# Patient Record
Sex: Male | Born: 1985 | Race: Black or African American | Hispanic: No | Marital: Single | Smoking: Current some day smoker
Health system: Southern US, Community
[De-identification: ages and names within clinical notes are randomized; demographics above are authoritative.]

## PROBLEM LIST (undated history)

## (undated) HISTORY — PX: OTHER SURGICAL HISTORY: SHX169

## (undated) HISTORY — PX: FACIAL RECONSTRUCTION SURGERY: SHX631

---

## 2005-06-14 ENCOUNTER — Emergency Department: Payer: Self-pay | Admitting: Emergency Medicine

## 2006-07-06 ENCOUNTER — Emergency Department: Payer: Self-pay | Admitting: Emergency Medicine

## 2006-07-15 ENCOUNTER — Ambulatory Visit: Payer: Self-pay | Admitting: Otolaryngology

## 2007-06-19 IMAGING — CT CT MAXILLOFACIAL WITHOUT CONTRAST
1 series · 16 of 30 positions shown, 20 images · non-contrast
Comparison: none

REASON FOR EXAM: INJURY
COMMENTS:

[Series 3: coronal · coronal · 0.32mm/px · 16 of 39 slices shown, 20 images]
[im 2/39  brain]
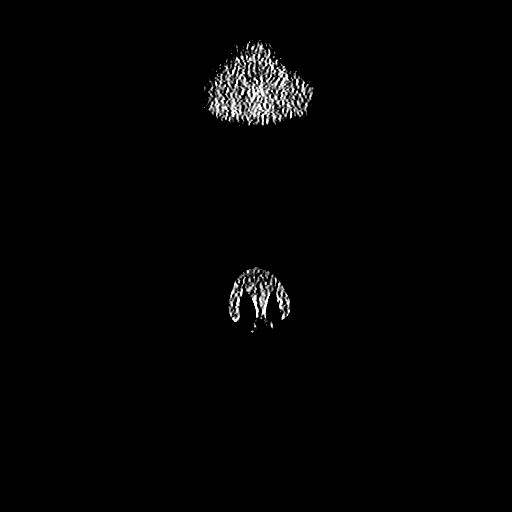
[im 2/39  bone]
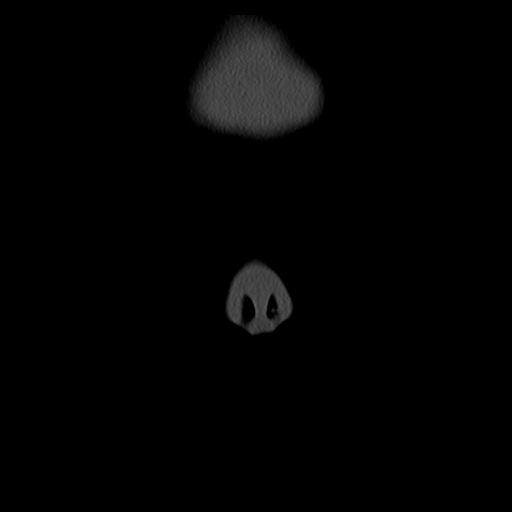
[im 4/39  bone]
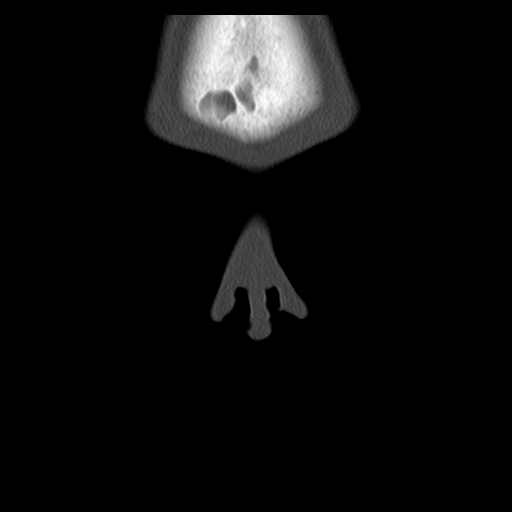
[im 7/39  bone]
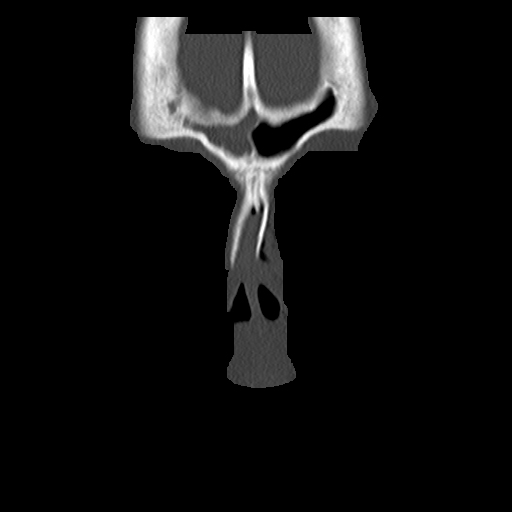
[im 10/39  bone]
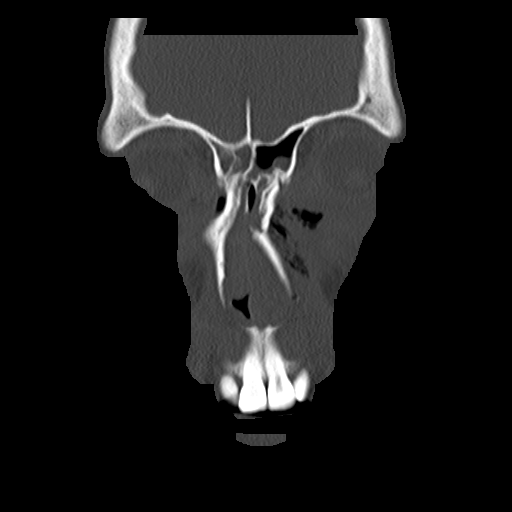
[im 11/39  brain]
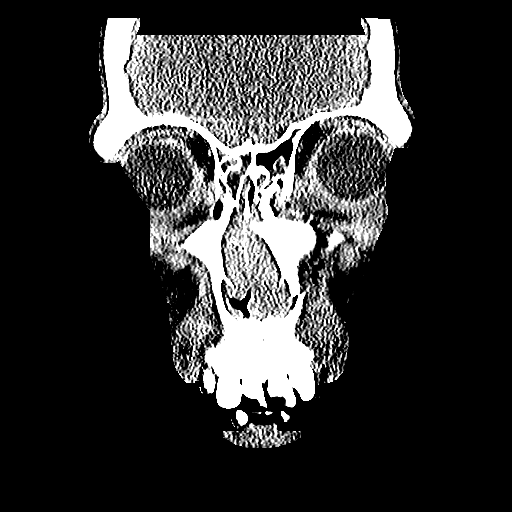
[im 11/39  bone]
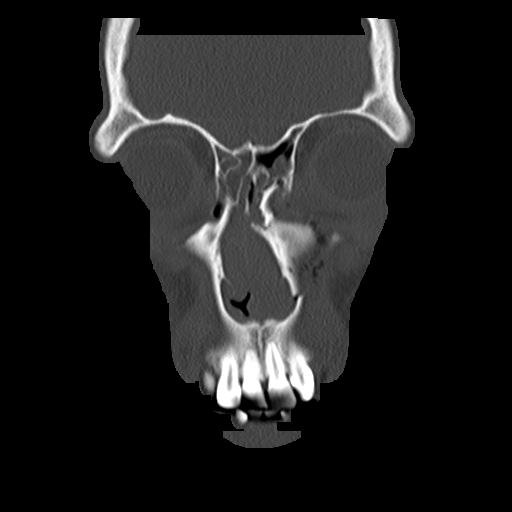
[im 14/39  bone]
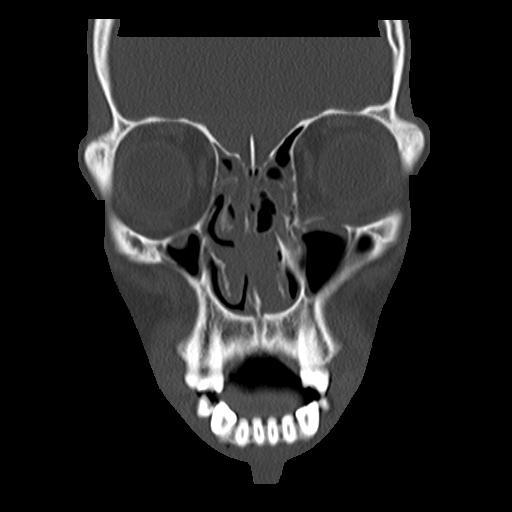
[im 16/39  bone]
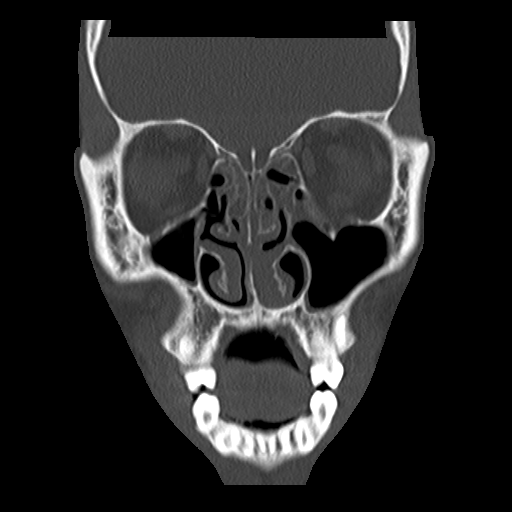
[im 19/39  bone]
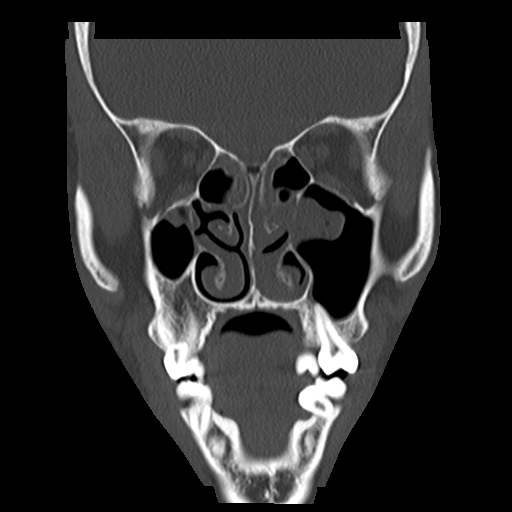
[im 20/39  brain]
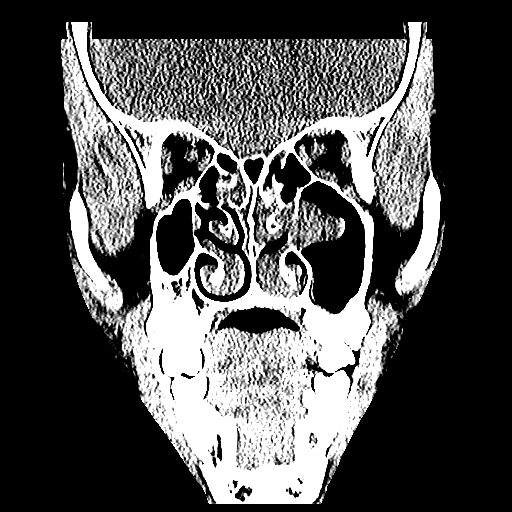
[im 20/39  bone]
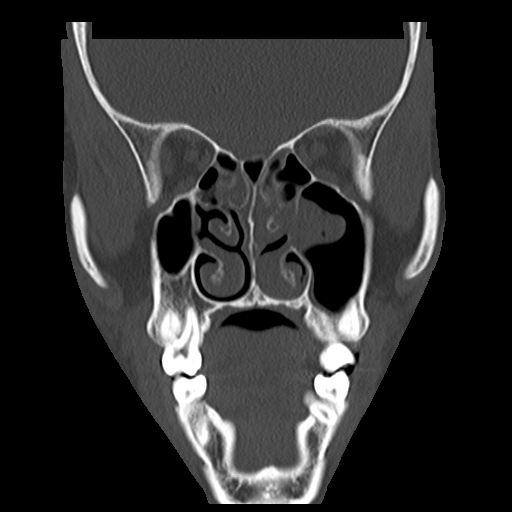
[im 23/39  bone]
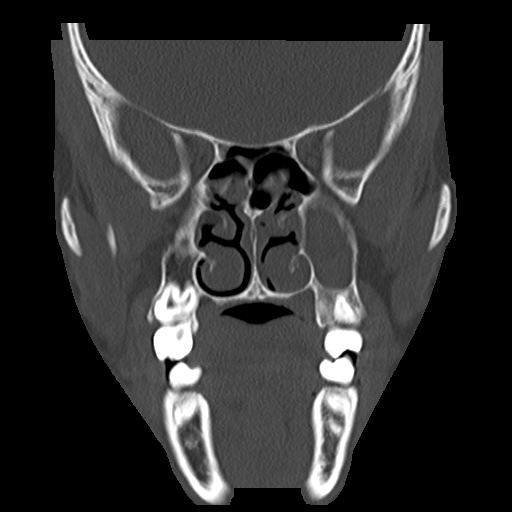
[im 25/39  bone]
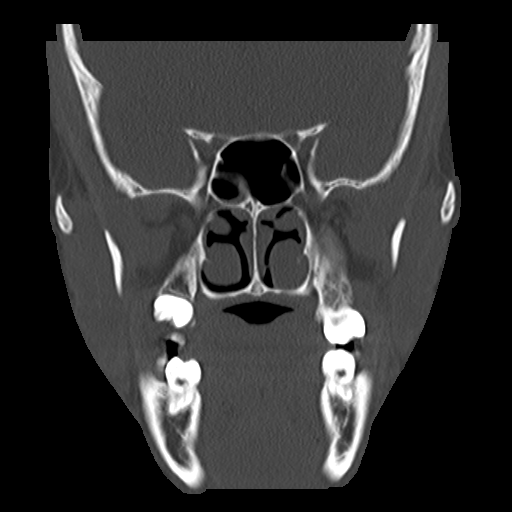
[im 28/39  bone]
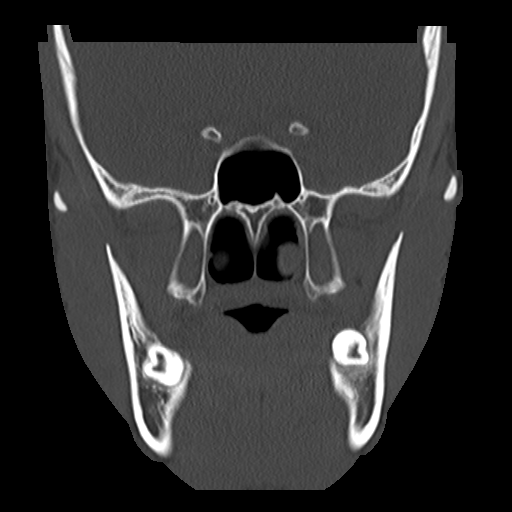
[im 29/39  brain]
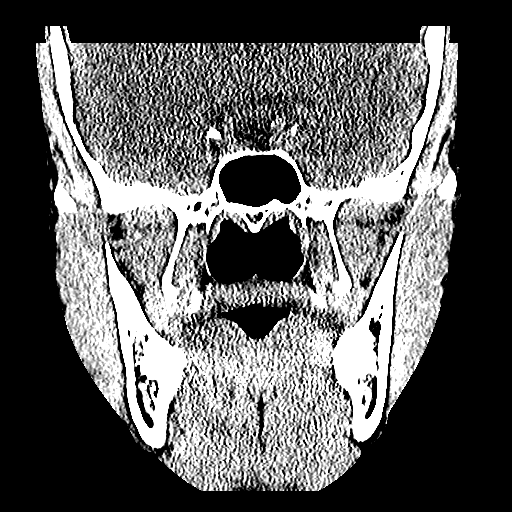
[im 29/39  bone]
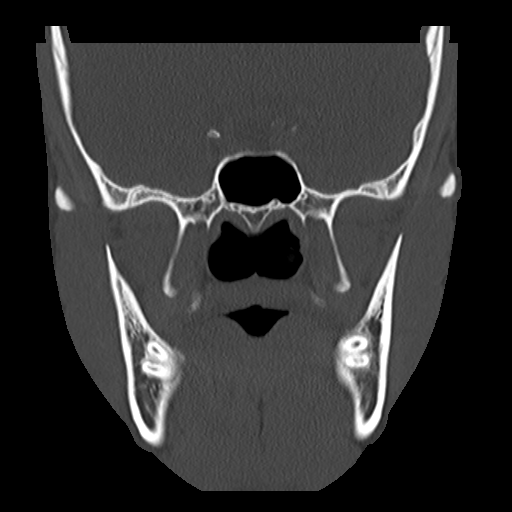
[im 32/39  bone]
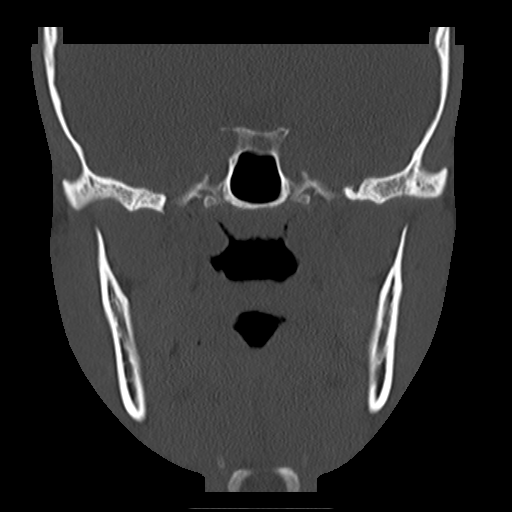
[im 35/39  bone]
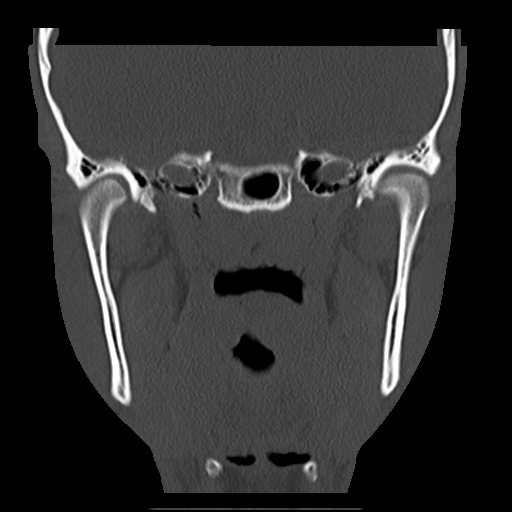
[im 37/39  bone]
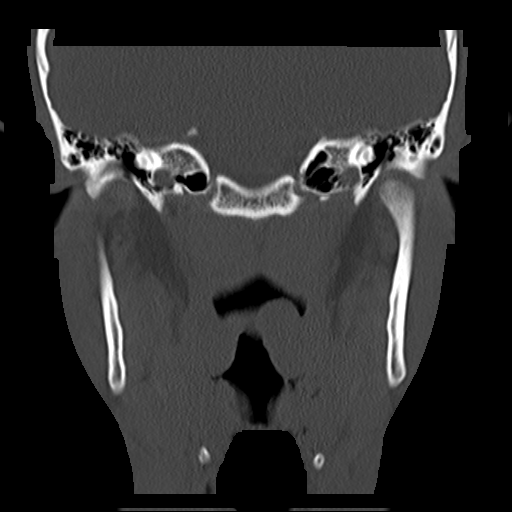

[16 of 30 positions shown; findings below may reference images not displayed]

PROCEDURE:     CT  - CT MAXILLOFACIAL AREA WO  - July 06, 2006  [DATE]

RESULT:         Comminuted fracture of the anterior maxillary wall and
medial maxillary sinus wall noted.  Posterior inferior orbital wall appears
to be fractured.   LEFT nasal fracture is present.  This is displaced.
Fluid is noted in the frontal and ethmoid sinus complexes bilaterally.
Fluid is noted in the LEFT maxillary sinus.  Subcutaneous air is noted about
the LEFT orbit.  The globe appears grossly intact as does the retrobulbar
space.  Zygomatic arches are intact.
IMPRESSION: 1.     Fractures of the anterior and medial walls of the LEFT maxillary
sinus are noted.  These are comminuted and displaced.  Inferior orbital rim
fracture on the LEFT is also present.   This is also severely displaced.
2.     There is associated severe comminuted displaced nasal fracture on the
LEFT.

## 2007-06-19 IMAGING — CR DG CHEST 2V
1 series · 2 of 2 positions shown · non-contrast
Comparison: none

REASON FOR EXAM: INJURY
COMMENTS:

PROCEDURE:     DXR - DXR CHEST PA (OR AP) AND LATERAL  - July 06, 2006  [DATE]
RESULT:     The lungs are well expanded and clear. The heart and pulmonary
vascularity are within the limits of normal. There is no pleural effusion.
The mediastinum is not widened.

[Series 1: view not recorded · 0.17mm/px · 2 of 2 slices shown]
[im 1/2]
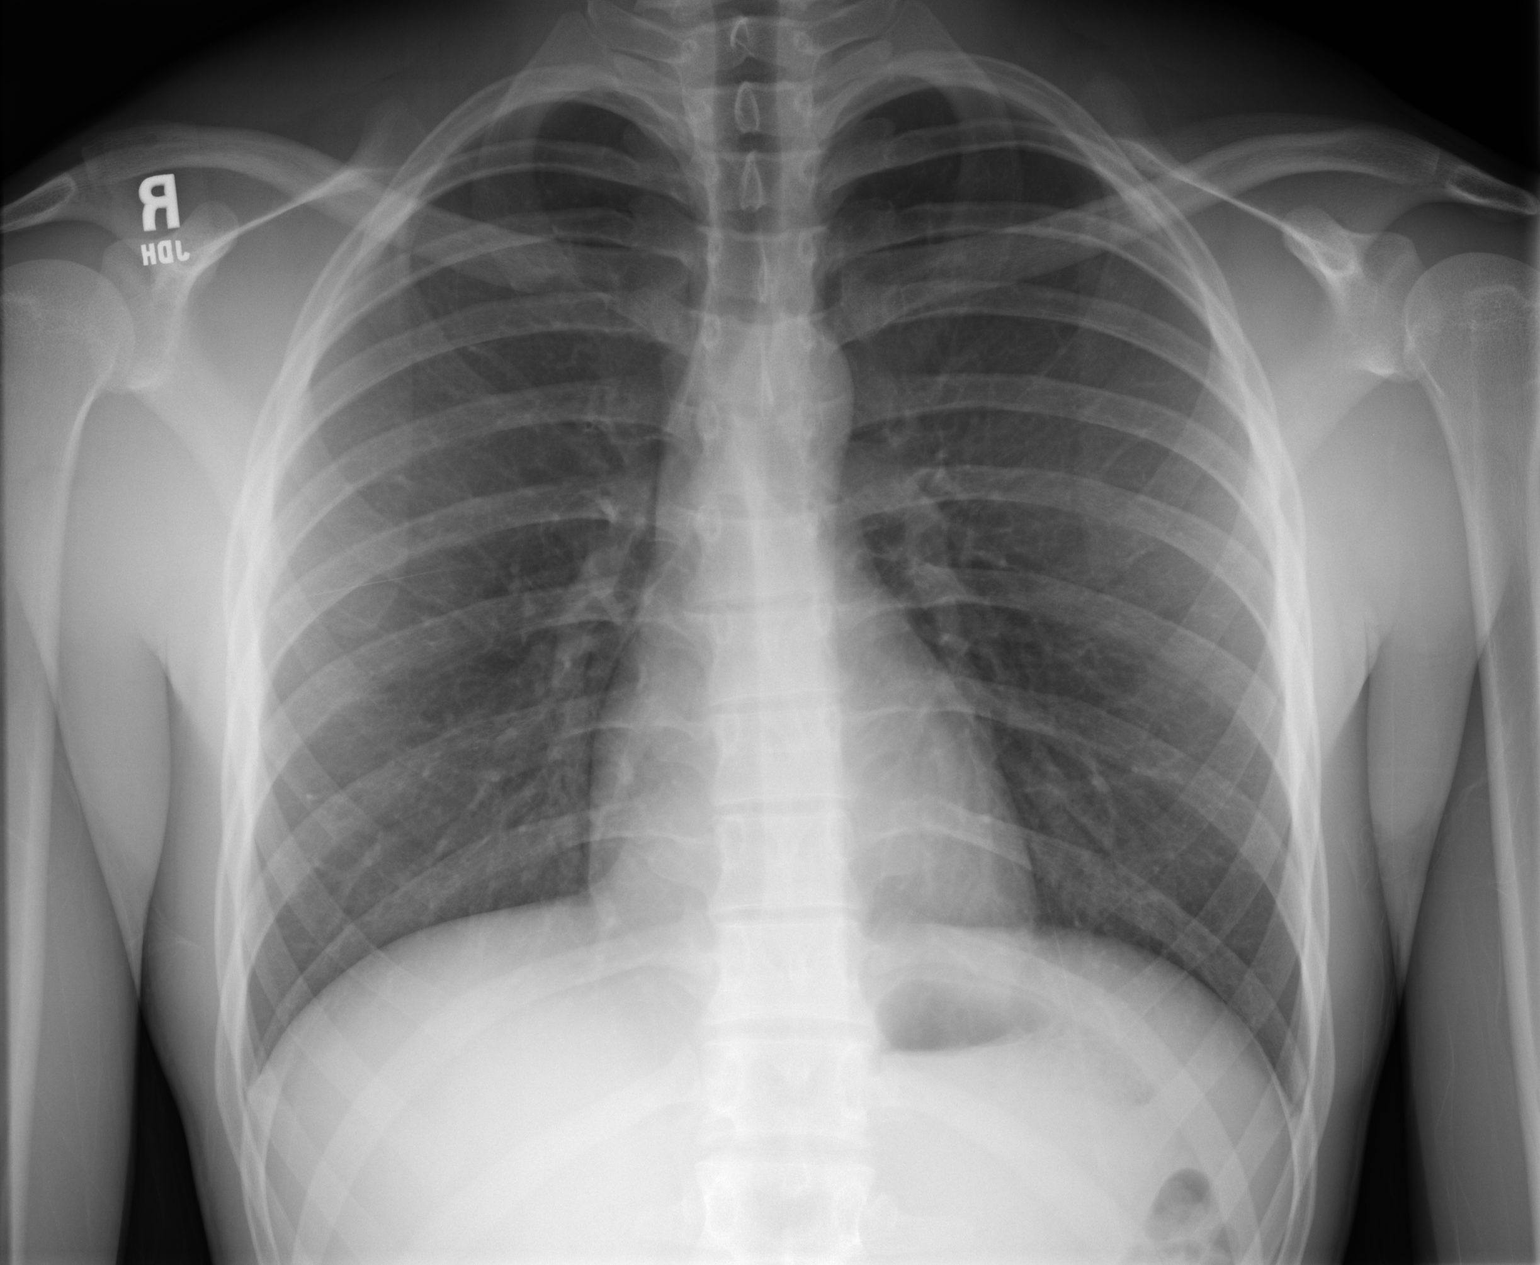
[im 2/2]
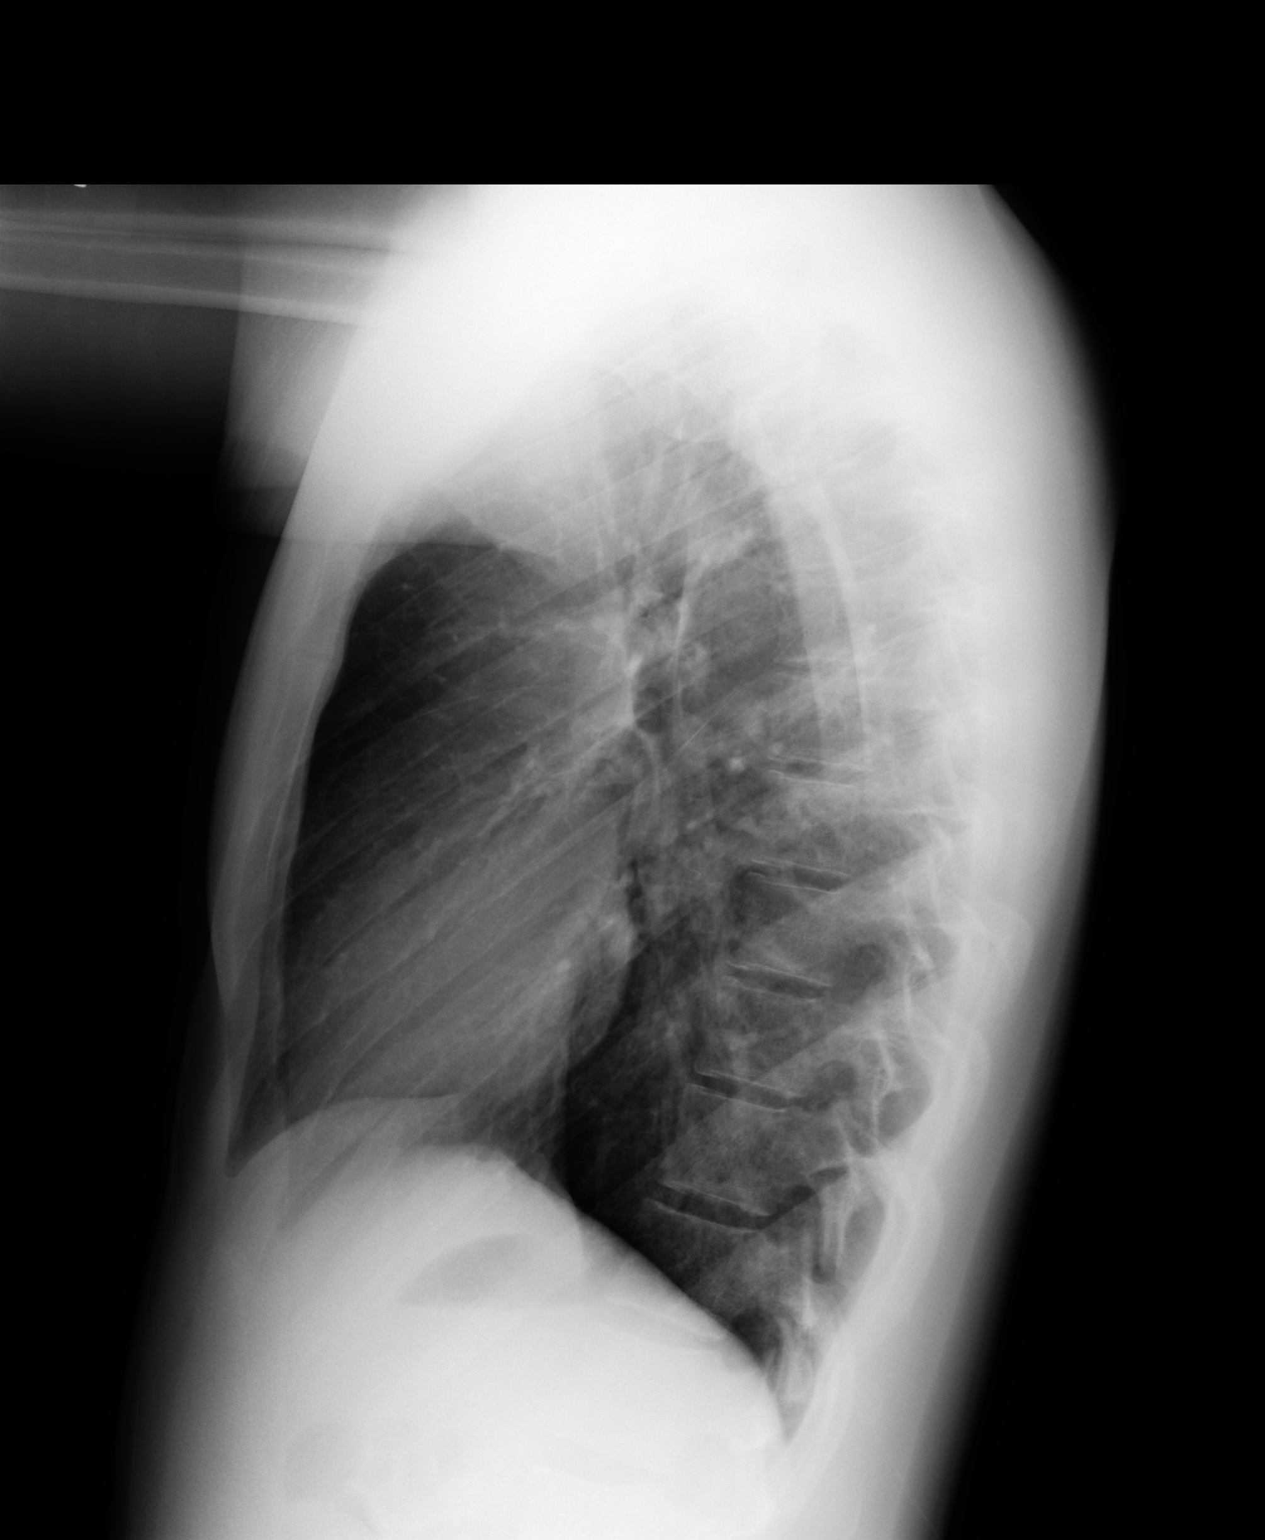

[2 of 2 positions shown; findings below may reference images not displayed]

IMPRESSION: I do not see evidence of acute cardiopulmonary disease. The
site of the patient's injury was not noted in the clinical history. I do not
see evidence of a pneumothorax or displaced rib fracture nor findings
suggestive of a pulmonary contusion. Follow-up CT imaging is available upon
request.

## 2008-10-10 ENCOUNTER — Emergency Department: Payer: Self-pay | Admitting: Emergency Medicine

## 2009-05-02 ENCOUNTER — Emergency Department: Payer: Self-pay | Admitting: Emergency Medicine

## 2009-09-11 ENCOUNTER — Emergency Department: Payer: Self-pay | Admitting: Emergency Medicine

## 2012-07-13 ENCOUNTER — Emergency Department: Payer: Self-pay | Admitting: Emergency Medicine

## 2013-06-26 IMAGING — CR RIGHT HAND - COMPLETE 3+ VIEW
1 series · 3 of 3 positions shown · non-contrast
Comparison: none

REASON FOR EXAM: swelling pain
COMMENTS:

PROCEDURE:     DXR - DXR HAND RT COMPLETE W/OBLIQUES  - July 13, 2012 [DATE]
RESULT:     Images of the right hand demonstrate no fracture, dislocation or
radiopaque foreign body. There is no evidence of bony destruction.

[Series 1: x hand pa right · 0.14mm/px · 3 of 3 slices shown]
[im 1/3]
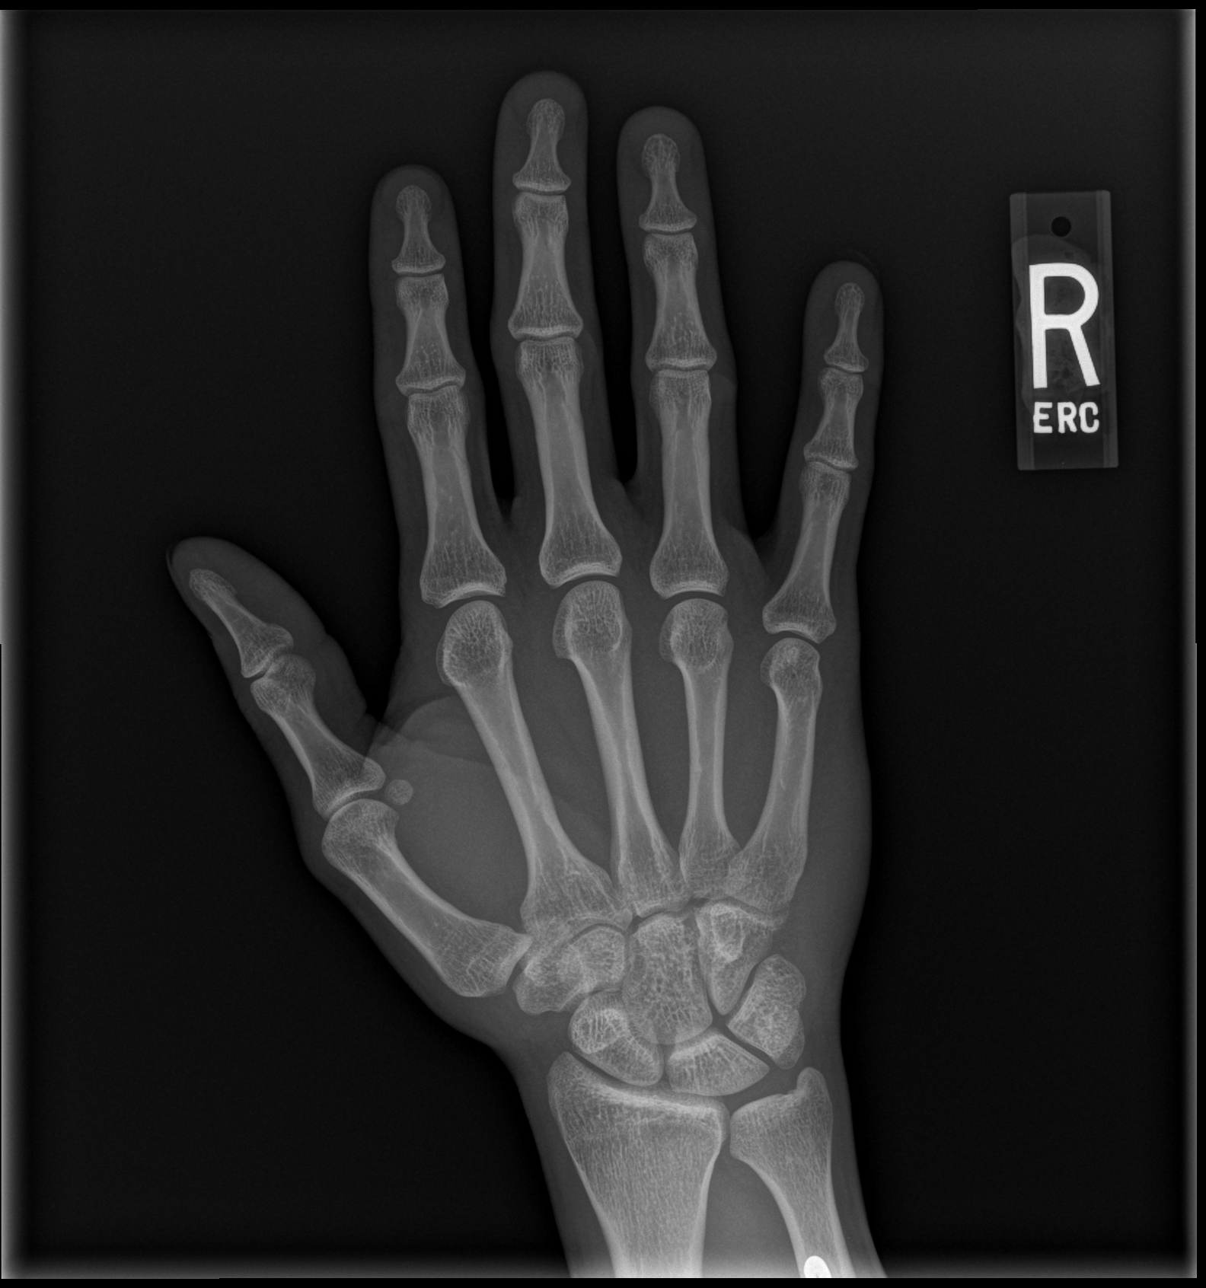
[im 2/3]
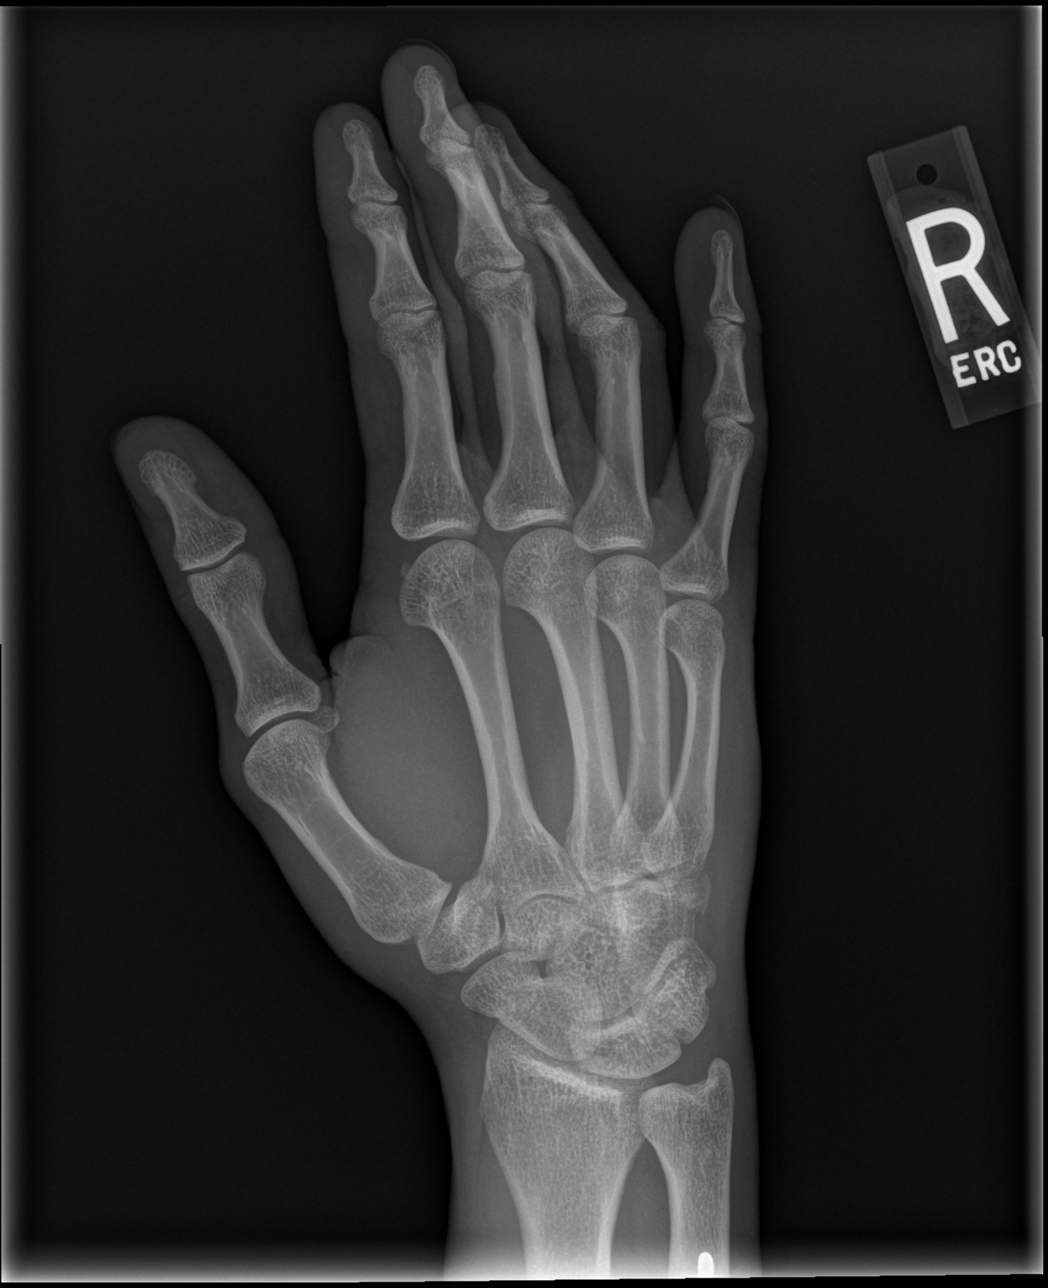
[im 3/3]
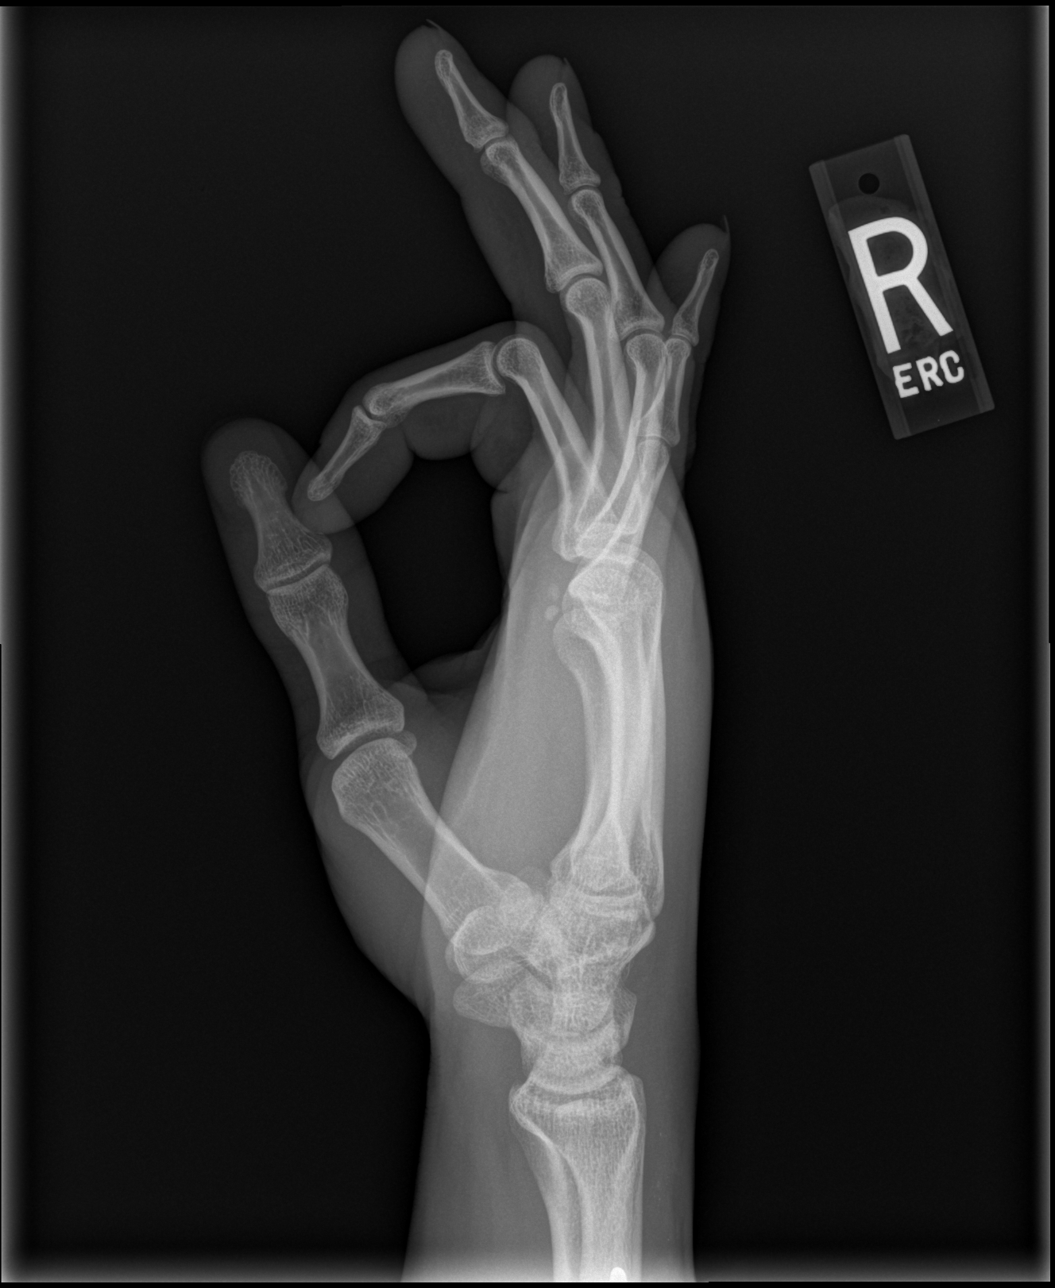

[3 of 3 positions shown; findings below may reference images not displayed]

IMPRESSION: Please see above.

[REDACTED]

## 2015-03-20 ENCOUNTER — Emergency Department: Payer: Self-pay | Admitting: Emergency Medicine

## 2015-03-20 LAB — CBC WITH DIFFERENTIAL/PLATELET
BASOS PCT: 0.3 %
Basophil #: 0 10*3/uL (ref 0.0–0.1)
Eosinophil #: 0.1 10*3/uL (ref 0.0–0.7)
Eosinophil %: 1.1 %
HCT: 43.6 % (ref 40.0–52.0)
HGB: 14.9 g/dL (ref 13.0–18.0)
Lymphocyte #: 1 10*3/uL (ref 1.0–3.6)
Lymphocyte %: 8.7 %
MCH: 31.9 pg (ref 26.0–34.0)
MCHC: 34 g/dL (ref 32.0–36.0)
MCV: 94 fL (ref 80–100)
Monocyte #: 0.7 x10 3/mm (ref 0.2–1.0)
Monocyte %: 6.1 %
NEUTROS ABS: 9.5 10*3/uL — AB (ref 1.4–6.5)
Neutrophil %: 83.8 %
Platelet: 224 10*3/uL (ref 150–440)
RBC: 4.65 10*6/uL (ref 4.40–5.90)
RDW: 13.4 % (ref 11.5–14.5)
WBC: 11.3 10*3/uL — AB (ref 3.8–10.6)

## 2015-03-20 LAB — COMPREHENSIVE METABOLIC PANEL
ALT: 12 U/L — AB
ANION GAP: 8 (ref 7–16)
Albumin: 4.7 g/dL
Alkaline Phosphatase: 52 U/L
BILIRUBIN TOTAL: 1.4 mg/dL — AB
BUN: 13 mg/dL
CO2: 27 mmol/L
Calcium, Total: 9.4 mg/dL
Chloride: 102 mmol/L
Creatinine: 1.05 mg/dL
EGFR (African American): >60
GLUCOSE: 96 mg/dL
POTASSIUM: 4 mmol/L
SGOT(AST): 21 U/L
SODIUM: 137 mmol/L
TOTAL PROTEIN: 7.3 g/dL

## 2015-03-20 LAB — LIPASE, BLOOD: Lipase: 21 U/L — ABNORMAL LOW

## 2016-10-27 ENCOUNTER — Encounter: Payer: Self-pay | Admitting: Emergency Medicine

## 2016-10-27 ENCOUNTER — Emergency Department
Admission: EM | Admit: 2016-10-27 | Discharge: 2016-10-27 | Disposition: A | Discharge status: Home / Self Care | Payer: Managed Care, Other (non HMO) | Attending: Emergency Medicine | Admitting: Emergency Medicine

## 2016-10-27 DIAGNOSIS — J069 Acute upper respiratory infection, unspecified: Secondary | ICD-10-CM | POA: Insufficient documentation

## 2016-10-27 DIAGNOSIS — F17200 Nicotine dependence, unspecified, uncomplicated: Secondary | ICD-10-CM | POA: Diagnosis not present

## 2016-10-27 DIAGNOSIS — R0981 Nasal congestion: Secondary | ICD-10-CM | POA: Diagnosis present

## 2016-10-27 LAB — POCT RAPID STREP A: Streptococcus, Group A Screen (Direct): NEGATIVE

## 2016-10-27 LAB — INFLUENZA PANEL BY PCR (TYPE A & B)
INFLBPCR: NEGATIVE
Influenza A By PCR: NEGATIVE

## 2016-10-27 MED ORDER — IBUPROFEN 600 MG PO TABS
600.0000 mg | ORAL_TABLET | Freq: Once | ORAL | Status: AC
  Administered 2016-10-27: 600 mg via ORAL
  Filled 2016-10-27: qty 1

## 2016-10-27 MED ORDER — OXYMETAZOLINE HCL 0.05 % NA SOLN
1.0000 | Freq: Once | NASAL | Status: AC
  Administered 2016-10-27: 1 via NASAL
  Filled 2016-10-27: qty 15

## 2016-10-27 MED ORDER — BENZONATATE 100 MG PO CAPS: 100.0000 mg | ORAL_CAPSULE | Freq: Four times a day (QID) | ORAL | 0 refills | Status: AC | PRN

## 2016-10-27 MED ORDER — BENZONATATE 100 MG PO CAPS
100.0000 mg | ORAL_CAPSULE | Freq: Once | ORAL | Status: AC
  Administered 2016-10-27: 100 mg via ORAL
  Filled 2016-10-27: qty 1

## 2016-10-27 MED ORDER — OXYMETAZOLINE HCL 0.05 % NA SOLN: 2.0000 | Freq: Two times a day (BID) | NASAL | 0 refills | Status: AC

## 2016-10-27 MED ORDER — ACETAMINOPHEN 325 MG PO TABS
650.0000 mg | ORAL_TABLET | Freq: Once | ORAL | Status: AC
Start: 2016-10-27 — End: 2016-10-27
  Administered 2016-10-27: 650 mg via ORAL
  Filled 2016-10-27: qty 2

## 2016-10-27 NOTE — ED Notes (Signed)

## 2016-10-27 NOTE — ED Notes (Signed)
Patient asking for liquid cough syrup citing that he could not taking pills. Patient took IBU, APAP, and Tessalon here without difficulties. Patient adamant that this RN ask MD for liquid. RN already instructed patient to obtain OTC syrup, however he wanted MD consulted for "something stronger". Spoke with Dr. Zenda AlpersWebster - MD advising that patient will need to get OTC Robitussin or Delsym if something other than the prescribed Tessalon was needed to treat his symptoms. Patient states, "I was hoping for that stuff that would knock me out"; MD aware.

## 2016-10-27 NOTE — ED Triage Notes (Signed)
Pt reports sore throat, cough, runny nose and chills for the last 2 days with intermittent headache pain to frontal area; has not taken any OTC meds for relief; pt in no acute distress;

## 2016-10-27 NOTE — ED Provider Notes (Signed)
[facility] Emergency Department Provider Note   ____________________________________________   First MD Initiated Contact with Patient 10/27/16 0330     (approximate)  I have reviewed the triage vital signs and the nursing notes.   HISTORY  Chief Complaint Cough; Sore Throat; Nasal Congestion; and Chills    HPI Johnny Kennedy is a 30 y.o. male who comes into the hospital today with a concern that he has an upper aspirin infection. The patient has had a cough with a headache. He's had these symptoms for the past 2 days. He denies any fever although he thinks he may have been feverish. He denies any sick contacts and he has not taken any medications for his symptoms. The patient reports that he's been unable to get some sleep so he thinks he may need something. He's had some nasal congestion as well as chills. He reports he's had a cold before but he has not had it this bad. The patient has head pain that he rates a 6 out of 10 in intensity. He hasn't eaten much as well due to not feeling well. He did have one episode of vomiting after coughing but no other nausea, shortness of breath, chest pain, abdominal pain. The patient is here tonight for evaluation.   History reviewed. No pertinent past medical history.  There are no active problems to display for this patient.   Past Surgical History:  Procedure Laterality Date  . facial reconstructive surgery      Prior to Admission medications   Not on File    Allergies Patient has no allergy information on record.  History reviewed. No pertinent family history.  Social History Social History  Substance Use Topics  . Smoking status: Current Some Day Smoker  . Smokeless tobacco: Never Used  . Alcohol use Yes    Review of Systems Constitutional: chills Eyes: No visual changes. ENT:  sore throat. Cardiovascular: Denies chest pain. Respiratory: Cough Gastrointestinal: No abdominal pain.  No  nausea, no vomiting.  No diarrhea.  No constipation. Genitourinary: Negative for dysuria. Musculoskeletal: Negative for back pain. Skin: Negative for rash. Neurological: Headache  10-point ROS otherwise negative.  ____________________________________________   PHYSICAL EXAM:  VITAL SIGNS: ED Triage Vitals [10/27/16 0126]  Enc Vitals Group     BP (!) 157/95     Pulse Rate (!) 102     Resp 18     Temp 98.8 F (37.1 C)     Temp Source Oral     SpO2 99 %     Weight 140 lb (63.5 kg)     Height 5\' 9"  (1.753 m)     Head Circumference      Peak Flow      Pain Score 5     Pain Loc      Pain Edu?      Excl. in GC?     Constitutional: Alert and oriented. Well appearing and in Mild distress. Eyes: Conjunctivae are normal. PERRL. EOMI. Head: Atraumatic. Nose: No congestion/rhinnorhea. Mouth/Throat: Mucous membranes are moist.  Oropharynx non-erythematous. Cardiovascular: Normal rate, regular rhythm. Grossly normal heart sounds.  Good peripheral circulation. Respiratory: Normal respiratory effort.  No retractions. Lungs CTAB. Gastrointestinal: Soft and nontender. No distention.  Musculoskeletal: No lower extremity tenderness nor edema.   Neurologic:  Normal speech and language.  Skin:  Skin is warm, dry and intact.  Psychiatric: Mood and affect are normal.   ____________________________________________   LABS (all labs ordered are listed, but only  abnormal results are displayed)  Labs Reviewed  INFLUENZA PANEL BY PCR (TYPE A & B, H1N1)  POCT RAPID STREP A   ____________________________________________  EKG  none ____________________________________________  RADIOLOGY  none ____________________________________________   PROCEDURES  Procedure(s) performed: None  Procedures  Critical Care performed: No  ____________________________________________   INITIAL IMPRESSION / ASSESSMENT AND PLAN / ED COURSE  Pertinent labs & imaging results that were  available during my care of the patient were reviewed by me and considered in my medical decision making (see chart for details).  This is a 30 year old male who comes in with some upper respiratory infection, cough, headache, congestion and chills. The patient is not been taking any medications at home. We did perform a rapid strep as well as a flu on the patient and his flu test and strep are both negative. The patient has not had any fevers. I will give the patient some IV Profen and Tylenol for his headache. He will also receive some benzonatate for his cough and Afrin for his congestion. I feel that the patient's symptoms are viral and his treatment is supportive care. I will discharge the patient home with some cough medicine as well as the Afrin and some medicine for his headache. The patient should follow-up with the acute care clinic for further evaluation. He should return if the symptoms are persistent after the next few days.  Clinical Course      ____________________________________________   FINAL CLINICAL IMPRESSION(S) / ED DIAGNOSES  Final diagnoses:  Viral upper respiratory tract infection      NEW MEDICATIONS STARTED DURING THIS VISIT:  New Prescriptions   No medications on file     Note:  This document was prepared using Dragon voice recognition software and may include unintentional dictation errors.    Rebecka ApleyAllison P Quinnlyn Hearns, MD 10/27/16 [phone]

## 2020-09-01 ENCOUNTER — Emergency Department
Admission: EM | Admit: 2020-09-01 | Discharge: 2020-09-01 | Disposition: A | Discharge status: Home / Self Care | Payer: Managed Care, Other (non HMO) | Attending: Student in an Organized Health Care Education/Training Program | Admitting: Student in an Organized Health Care Education/Training Program

## 2020-09-01 ENCOUNTER — Other Ambulatory Visit: Payer: Self-pay

## 2020-09-01 ENCOUNTER — Encounter: Payer: Self-pay | Admitting: Emergency Medicine

## 2020-09-01 DIAGNOSIS — F17200 Nicotine dependence, unspecified, uncomplicated: Secondary | ICD-10-CM | POA: Insufficient documentation

## 2020-09-01 DIAGNOSIS — Z79899 Other long term (current) drug therapy: Secondary | ICD-10-CM | POA: Insufficient documentation

## 2020-09-01 DIAGNOSIS — K047 Periapical abscess without sinus: Secondary | ICD-10-CM

## 2020-09-01 MED ORDER — TRAMADOL HCL 50 MG PO TABS: 50.0000 mg | ORAL_TABLET | Freq: Four times a day (QID) | ORAL | 0 refills | Status: AC | PRN

## 2020-09-01 MED ORDER — AMOXICILLIN 500 MG PO TABS: 500.0000 mg | ORAL_TABLET | Freq: Three times a day (TID) | ORAL | 0 refills | Status: DC

## 2020-09-01 NOTE — ED Triage Notes (Signed)
Here because when woke up this AM had swelling to left face. Was having pain to gum last night and took motrin which helped some.  NAD. Unlabored.

## 2020-09-01 NOTE — ED Provider Notes (Signed)
[facility] Emergency Department Provider Note ____________________________________________  Time seen: Approximately 9:25 AM  I have reviewed the triage vital signs and the nursing notes.   HISTORY  Chief Complaint Dental Injury   HPI Johnny Kennedy is a 34 y.o. male presents to the emergency department for treatment and evaluation of dental pain and facial swelling.  Patient states that last night he went to bed and he had some pain in his mouth but it was not swollen.  Upon awakening this morning.  His face was swollen and the pain was "excruciating."  Patient took some ibuprofen which has provided with some relief.  He does have a dentist but the dentist will not be back in the office until Monday.   History reviewed. No pertinent past medical history.  There are no problems to display for this patient.   Past Surgical History:  Procedure Laterality Date  . FACIAL RECONSTRUCTION SURGERY    . facial reconstructive surgery      Prior to Admission medications   Medication Sig Start Date End Date Taking? Authorizing Provider  amoxicillin (AMOXIL) 500 MG tablet Take 1 tablet (500 mg total) by mouth 3 (three) times daily. 09/01/20   Saima Monterroso B, FNP  benzonatate (TESSALON PERLES) 100 MG capsule Take 1 capsule (100 mg total) by mouth every 6 (six) hours as needed for cough. 10/27/16   Rebecka Apley, MD  traMADol (ULTRAM) 50 MG tablet Take 1 tablet (50 mg total) by mouth every 6 (six) hours as needed. 09/01/20   Chinita Pester, FNP    Allergies Other  History reviewed. No pertinent family history.  Social History Social History   Tobacco Use  . Smoking status: Current Some Day Smoker  . Smokeless tobacco: Never Used  Substance Use Topics  . Alcohol use: Yes  . Drug use: No    Review of Systems Constitutional: Negative for fever or recent illness. ENT: Positive for dental pain. Musculoskeletal: Negative for trismus of the jaw.  Skin:  Negative for wound or lesion. ____________________________________________   PHYSICAL EXAM:  VITAL SIGNS: ED Triage Vitals  Enc Vitals Group     BP 09/01/20 0804 123/81     Pulse Rate 09/01/20 0804 76     Resp 09/01/20 0804 18     Temp 09/01/20 0804 97.8 F (36.6 C)     Temp src --      SpO2 09/01/20 0804 99 %     Weight 09/01/20 0757 135 lb (61.2 kg)     Height 09/01/20 0757 5\' 8"  (1.727 m)     Head Circumference --      Peak Flow --      Pain Score 09/01/20 0757 5     Pain Loc --      Pain Edu? --      Excl. in GC? --     Constitutional: Alert and oriented. Well appearing and in no acute distress. Eyes: Conjunctiva are clear without discharge or drainage. Mouth/Throat: Sublingual surface is soft. Periodontal Exam    Hematological/Lymphatic/Immunilogical: No palpable cervical adenopathy. Respiratory: Respirations even and unlabored. Musculoskeletal: Full ROM of the jaw. Neurologic: Awake, alert, oriented.  Skin: Facial swelling associated with area of dental decay Psychiatric: Affect and behavior intact.  ____________________________________________   LABS (all labs ordered are listed, but only abnormal results are displayed)  Labs Reviewed - No data to display ____________________________________________   RADIOLOGY  Not indicated. ____________________________________________   PROCEDURES  Procedure(s) performed:   Procedures  Critical Care performed: No ____________________________________________   INITIAL IMPRESSION / ASSESSMENT AND PLAN / ED COURSE  Johnny Kennedy is a 34 y.o. male presenting to the emergency department for treatment and evaluation of dental pain.  See HPI for further details.  Plan will be to get him on some antibiotics and have him follow-up with his dentist on Monday.  He will be encouraged to rotate Tylenol and ibuprofen for pain.  He is to return to the emergency department for symptoms of change or worsen if he is  unable to see the dentist.  Pertinent labs & imaging results that were available during my care of the patient were reviewed by me and considered in my medical decision making (see chart for details).  ____________________________________________   FINAL CLINICAL IMPRESSION(S) / ED DIAGNOSES  Final diagnoses:  Dental abscess    New Prescriptions   AMOXICILLIN (AMOXIL) 500 MG TABLET    Take 1 tablet (500 mg total) by mouth 3 (three) times daily.   TRAMADOL (ULTRAM) 50 MG TABLET    Take 1 tablet (50 mg total) by mouth every 6 (six) hours as needed.    If controlled substance prescribed during this visit, 12 month history viewed on the [facility] prior to issuing an initial prescription for Schedule II or III opiod.  Note:  This document was prepared using Dragon voice recognition software and may include unintentional dictation errors.   Chinita Pester, FNP 09/01/20 0940    Gilles Chiquito, MD 09/01/20 1017

## 2020-09-01 NOTE — Discharge Instructions (Signed)
Please call and schedule a dental appointment as soon as possible. You will need to be seen within the next 14 days. Return to the emergency department for symptoms that change or worsen if you're unable to schedule an appointment. ° °

## 2023-12-11 ENCOUNTER — Other Ambulatory Visit: Payer: Self-pay

## 2023-12-11 ENCOUNTER — Emergency Department: Payer: 59

## 2023-12-11 ENCOUNTER — Emergency Department
Admission: EM | Admit: 2023-12-11 | Discharge: 2023-12-11 | Disposition: A | Discharge status: Home / Self Care | Payer: 59 | Attending: Emergency Medicine | Admitting: Emergency Medicine

## 2023-12-11 DIAGNOSIS — H1032 Unspecified acute conjunctivitis, left eye: Secondary | ICD-10-CM | POA: Diagnosis not present

## 2023-12-11 DIAGNOSIS — R519 Headache, unspecified: Secondary | ICD-10-CM

## 2023-12-11 MED ORDER — AMOXICILLIN-POT CLAVULANATE 875-125 MG PO TABS: 1.0000 | ORAL_TABLET | Freq: Two times a day (BID) | ORAL | 0 refills | Status: AC

## 2023-12-11 MED ORDER — FLUORESCEIN SODIUM 1 MG OP STRP
1.0000 | ORAL_STRIP | Freq: Once | OPHTHALMIC | Status: AC
  Administered 2023-12-11: 1 via OPHTHALMIC
  Filled 2023-12-11: qty 1

## 2023-12-11 MED ORDER — GENTAMICIN SULFATE 0.3 % OP SOLN: 1.0000 [drp] | Freq: Three times a day (TID) | OPHTHALMIC | 0 refills | Status: AC

## 2023-12-11 NOTE — Discharge Instructions (Signed)
Your exam and CT scan are overall normal and reassuring at this time.  There is evidence of some mild acute sinusitis.  You be treated for sinus infection as well as conjunctivitis.  Take the antibiotics as prescribed and use the eyedrops as directed.

## 2023-12-11 NOTE — ED Provider Notes (Signed)
[facility] Emergency Department Provider Note     None    (approximate)   History   Migraine   HPI  Johnny Kennedy is a 37 y.o. male with a noncontributory medical history, presents to the ED with what he describes as a "migraine" with associated blurry vision.  He describes 2 weeks of symptoms that include some left eye irritation that he is showing was pinkeye.  Also noted some pressure around the left eye.  The last 2 days he has had a left-sided headache associate with symptoms.  He does the headache at the time of this presentation is nearly resolved.  Patient's not take any medications in the interim for symptom relief.  He denies any known triggers or preceding incidents.  Does not take medications for migraines.  For also endorse some dizziness but denies any nausea, vomiting, tinnitus, vertigo hearing loss, or vision loss.  No recent injury or trauma is reported.  No cough or fevers are noted.  Patient does endorse some light sensitivity.  Physical Exam   Triage Vital Signs: ED Triage Vitals  Encounter Vitals Group     BP 12/11/23 1424 124/89     Systolic BP Percentile --      Diastolic BP Percentile --      Pulse Rate 12/11/23 1424 (!) 103     Resp 12/11/23 1424 17     Temp 12/11/23 1424 98.3 F (36.8 C)     Temp Source 12/11/23 1424 Oral     SpO2 12/11/23 1424 99 %     Weight 12/11/23 1423 134 lb 14.7 oz (61.2 kg)     Height 12/11/23 1423 5\' 8"  (1.727 m)     Head Circumference --      Peak Flow --      Pain Score 12/11/23 1423 5     Pain Loc --      Pain Education --      Exclude from Growth Chart --     Most recent vital signs: Vitals:   12/11/23 1424  BP: 124/89  Pulse: (!) 103  Resp: 17  Temp: 98.3 F (36.8 C)  SpO2: 99%    General Awake, no distress. NAD HEENT NCAT. PERRL. EOMI. no gross foreign body noted to the cornea.  No fluorescein dye uptake is appreciated.  Normal fundi bilaterally.  No rhinorrhea. Mucous  membranes are moist.  CV:  Good peripheral perfusion.  RESP:  Normal effort.  ABD:  No distention.  NEURO: Regular 2-12 grossly intact.  ED Results / Procedures / Treatments   Labs (all labs ordered are listed, but only abnormal results are displayed) Labs Reviewed - No data to display   EKG   RADIOLOGY  I personally viewed and evaluated these images as part of my medical decision making, as well as reviewing the written report by the radiologist.  ED Provider Interpretation: No acute findings  CT HEAD WO CONTRAST ( ) Result Date: 12/11/2023 CLINICAL DATA:  Migraine and blurred vision, light sensitivity to EXAM: CT HEAD WITHOUT CONTRAST TECHNIQUE: Contiguous axial images were obtained from the base of the skull through the vertex without intravenous contrast. RADIATION DOSE REDUCTION: This exam was performed according to the departmental dose-optimization program which includes automated exposure control, adjustment of the mA and/or kV according to patient size and/or use of iterative reconstruction technique. COMPARISON:  None Available. FINDINGS: Brain: No evidence of acute infarction, hemorrhage, mass, mass effect, or midline shift. No hydrocephalus or extra-axial  fluid collection. Vascular: No hyperdense vessel. Skull: Negative for fracture or focal lesion. Sinuses/Orbits: Mucosal thickening in the paranasal sinuses, most prominent in the frontal sinuses and anterior ethmoid air cells, which are nearly completely opacified. No acute finding in the orbits. Other: The mastoid air cells are well aerated. IMPRESSION: 1. No acute intracranial process. 2. Mucosal thickening in the paranasal sinuses, most prominent in the frontal sinuses and anterior ethmoid air cells. This is nonspecific but could be seen in the setting of acute sinusitis. Correlate with symptoms. Electronically Signed   By: Wiliam Ke M.D.   On: 12/11/2023 15:57     PROCEDURES:  Critical Care performed:  No  Procedures   MEDICATIONS ORDERED IN ED: Medications  fluorescein ophthalmic strip 1 strip (1 strip Both Eyes Given 12/11/23 1650)     IMPRESSION / MDM / ASSESSMENT AND PLAN / ED COURSE  I reviewed the triage vital signs and the nursing notes.                              Differential diagnosis includes, but is not limited to, intracranial hemorrhage, meningitis/encephalitis, previous head trauma, cavernous venous thrombosis, tension headache, temporal arteritis, migraine or migraine equivalent, idiopathic intracranial hypertension, and non-specific headache.  Patient's presentation is most consistent with acute complicated illness / injury requiring diagnostic workup.  Patient's diagnosis is consistent with likely sinus headache and acute bacterial conjunctivitis of the left eye.  Patient with a reassuring exam and workup at this time, including a negative head CT reviewed by me.  Symptoms may be related to an acute sinusitis.  Acute neuro deficits noted on exam.  Patient will be discharged home with prescriptions for Augmentin and gentamicin ophthalmic solution. Patient is to follow up with [facility] as discussed, as needed or otherwise directed. Patient is given ED precautions to return to the ED for any worsening or new symptoms.   FINAL CLINICAL IMPRESSION(S) / ED DIAGNOSES   Final diagnoses:  Sinus headache  Acute bacterial conjunctivitis of left eye     Rx / DC Orders   ED Discharge Orders          Ordered    amoxicillin-clavulanate (AUGMENTIN) 875-125 MG tablet  2 times daily        12/11/23 1644    gentamicin (GARAMYCIN) 0.3 % ophthalmic solution  3 times daily        12/11/23 1644             Note:  This document was prepared using Dragon voice recognition software and may include unintentional dictation errors.    Lissa Hoard, PA-C 12/11/23 2345    Merwyn Katos, MD 12/16/23 [phone]

## 2023-12-11 NOTE — ED Triage Notes (Signed)
Pt here with a migraine and blurred vision x2 weeks ago. Pt does not take medication for his migraines. Pt currently has the blurred vision along with dizziness. Pt denies CP or SOB. Pt also sensitive to light. Pt ambulatory to triage.

## 2024-05-25 ENCOUNTER — Emergency Department
Admission: EM | Admit: 2024-05-25 | Discharge: 2024-05-25 | Disposition: A | Discharge status: Home / Self Care | Attending: Emergency Medicine | Admitting: Emergency Medicine

## 2024-05-25 ENCOUNTER — Encounter: Payer: Self-pay | Admitting: Emergency Medicine

## 2024-05-25 ENCOUNTER — Other Ambulatory Visit: Payer: Self-pay

## 2024-05-25 DIAGNOSIS — R519 Headache, unspecified: Secondary | ICD-10-CM | POA: Diagnosis present

## 2024-05-25 DIAGNOSIS — U071 COVID-19: Secondary | ICD-10-CM | POA: Diagnosis not present

## 2024-05-25 LAB — RESP PANEL BY RT-PCR (RSV, FLU A&B, COVID)  RVPGX2
Influenza A by PCR: NEGATIVE
Influenza B by PCR: NEGATIVE
Resp Syncytial Virus by PCR: NEGATIVE
SARS Coronavirus 2 by RT PCR: POSITIVE — AB

## 2024-05-25 LAB — GROUP A STREP BY PCR: Group A Strep by PCR: NOT DETECTED

## 2024-05-25 MED ORDER — ONDANSETRON 4 MG PO TBDP
4.0000 mg | ORAL_TABLET | Freq: Once | ORAL | Status: AC
  Administered 2024-05-25: 4 mg via ORAL
  Filled 2024-05-25: qty 1

## 2024-05-25 MED ORDER — ONDANSETRON 4 MG PO TBDP: 4.0000 mg | ORAL_TABLET | Freq: Three times a day (TID) | ORAL | 0 refills | Status: AC | PRN

## 2024-05-25 MED ORDER — NAPROXEN 500 MG PO TABS: 500.0000 mg | ORAL_TABLET | Freq: Two times a day (BID) | ORAL | 0 refills | Status: AC

## 2024-05-25 MED ORDER — NAPROXEN 500 MG PO TABS
500.0000 mg | ORAL_TABLET | Freq: Once | ORAL | Status: AC
  Administered 2024-05-25: 500 mg via ORAL
  Filled 2024-05-25: qty 1

## 2024-05-25 NOTE — ED Triage Notes (Signed)
 Patient to ED via POV for migraine. States ongoing since last night with blurred vision and nausea.

## 2024-05-25 NOTE — ED Provider Notes (Signed)
 [facility] Provider Note    Event Date/Time   First MD Initiated Contact with Patient 05/25/24 0725     (approximate)   History   Migraine   HPI  Johnny Kennedy is a 38 y.o. male  with no significant past medical history and as listed in EMR presents to the emergency department for treatment and evaluation of headache, blurred vision, nausea and generalized body aches starting last night.       Physical Exam   Triage Vital Signs: ED Triage Vitals  Encounter Vitals Group     BP 05/25/24 0722 (!) 145/81     Systolic BP Percentile --      Diastolic BP Percentile --      Pulse Rate 05/25/24 0722 (!) 113     Resp 05/25/24 0722 17     Temp 05/25/24 0722 99.1 F (37.3 C)     Temp Source 05/25/24 0722 Oral     SpO2 05/25/24 0722 99 %     Weight 05/25/24 0723 175 lb (79.4 kg)     Height 05/25/24 0723 5\' 9"  (1.753 m)     Head Circumference --      Peak Flow --      Pain Score 05/25/24 0723 7     Pain Loc --      Pain Education --      Exclude from Growth Chart --     Most recent vital signs: Vitals:   05/25/24 0722  BP: (!) 145/81  Pulse: (!) 113  Resp: 17  Temp: 99.1 F (37.3 C)  SpO2: 99%    General: Awake, no distress.  CV:  Good peripheral perfusion.  Resp:  Normal effort. Breath sounds are clear. Abd:  No distention.  Other:     ED Results / Procedures / Treatments   Labs (all labs ordered are listed, but only abnormal results are displayed) Labs Reviewed  RESP PANEL BY RT-PCR (RSV, FLU A&B, COVID)  [id number] - Abnormal; Notable for the following components:      Result Value   SARS Coronavirus 2 by RT PCR POSITIVE (*)    All other components within normal limits  GROUP A STREP BY PCR     EKG  Not indicated.   RADIOLOGY  Image and radiology report reviewed and interpreted by me. Radiology report consistent with the same.  Not indicated.  PROCEDURES:  Critical Care performed: No  Procedures   MEDICATIONS  ORDERED IN ED:  Medications  ondansetron  (ZOFRAN -ODT) disintegrating tablet 4 mg (4 mg Oral Given 05/25/24 0853)  naproxen  (NAPROSYN ) tablet 500 mg (500 mg Oral Given 05/25/24 0853)     IMPRESSION / MDM / ASSESSMENT AND PLAN / ED COURSE   I have reviewed the triage note.  Differential diagnosis includes, but is not limited to, viral syndrome, Covid, Influenza, migraine.  Patient's presentation is most consistent with acute illness / injury with system symptoms.  38 year old male presenting to the emergency department for treatment and evaluation of headache, blurred vision, nausea, generalized body aches that started last night.  See HPI.  Vital signs obtained in triage indicate that he is mildly hypertensive.  Somewhat tachycardic at 113 with a temperature of 99.1.  Exam is reassuring.  Respiratory panel indicates he is COVID-positive.  Results discussed with the patient.  Symptomatic treatment, home care, outpatient follow-up, and ER return precautions discussed prior to discharge.      FINAL CLINICAL IMPRESSION(S) / ED DIAGNOSES   Final diagnoses:  COVID     Rx / DC Orders   ED Discharge Orders          Ordered    naproxen  (NAPROSYN ) 500 MG tablet  2 times daily with meals        05/25/24 0832    ondansetron  (ZOFRAN -ODT) 4 MG disintegrating tablet  Every 8 hours PRN        05/25/24 1610             Note:  This document was prepared using Dragon voice recognition software and may include unintentional dictation errors.   Sherryle Don, FNP 05/26/24 [id number]    Viviano Ground, MD 05/31/24 1027

## 2024-05-25 NOTE — ED Notes (Signed)
 38 yom with a c/c of a headache, sore throat, nausea, and overall not feeling well since last night. The pt is warm, pink, and dry. The pt alert and oriented x 4. Call bell at bedside.
